# Patient Record
Sex: Female | Born: 1980 | Race: White | Hispanic: No | Marital: Single | Smoking: Never smoker
Health system: Southern US, Community
[De-identification: ages and names within clinical notes are randomized; demographics above are authoritative.]

---

## 2013-02-24 ENCOUNTER — Telehealth: Payer: Self-pay | Admitting: *Deleted

## 2013-02-24 NOTE — Telephone Encounter (Signed)
Pt called and left VM on nurse line stating that she needed refills on medications and needed and appointment. Nurse returned call, no answer, left VM.

## 2013-02-28 ENCOUNTER — Telehealth: Payer: Self-pay | Admitting: *Deleted

## 2013-02-28 NOTE — Telephone Encounter (Signed)
Pt returned nurse's call from last week. She stated that she used to be a pt here in office before previous MD left. She stated that she had not transferred and that she needed a refill on medications. She stated that she had not been to any other provider and that when MD left he gave her several refills on meds. Appointment time given on 03/14/2013 with MD. Pt appreciative and understanding.

## 2013-03-14 ENCOUNTER — Ambulatory Visit: Payer: Self-pay | Admitting: Family Medicine

## 2013-03-14 ENCOUNTER — Telehealth: Payer: Self-pay | Admitting: Family Medicine

## 2013-03-14 ENCOUNTER — Telehealth: Payer: Self-pay | Admitting: *Deleted

## 2013-03-14 NOTE — Telephone Encounter (Signed)
Please let her know i'm sorry I was not able to see her today. There are 2 options: 1 - Im set up with [employer] now, so she can be seen as early as tomorrow if she likes. We can check her thyroid level that same day and ensure the prescription she gets is correct for her current condition.  Or... 2 - If she is not able to come in this week, I can call in her current dose. However, this will be a one month supply and we may need to change the dosing before the supply is through.   Either way is fine - her preference. Let's get her scheduled for as soon as she is able. Thanks.

## 2013-03-14 NOTE — Telephone Encounter (Signed)
Patient came in the office this am, to see you for an appt, but she is [employer] and wasn't able to be seen.  Wants to know if you will write her thyroid medication until she can get in to see you.  She will be out end of this week.Marland Kitchen

## 2013-03-25 ENCOUNTER — Telehealth: Payer: Self-pay | Admitting: *Deleted

## 2013-03-25 NOTE — Telephone Encounter (Signed)
Pt called and left VM stating that she needed to make an appointment to be seen by MD. Nurse returned call, no answer, message left for callback

## 2013-03-28 ENCOUNTER — Encounter: Payer: Self-pay | Admitting: Family Medicine

## 2013-03-28 ENCOUNTER — Ambulatory Visit (INDEPENDENT_AMBULATORY_CARE_PROVIDER_SITE_OTHER): Payer: BC Managed Care – PPO | Admitting: Family Medicine

## 2013-03-28 VITALS — BP 102/70 | Ht 68.0 in | Wt 215.2 lb

## 2013-03-28 DIAGNOSIS — E039 Hypothyroidism, unspecified: Secondary | ICD-10-CM

## 2013-03-28 MED ORDER — FLUTICASONE PROPIONATE 50 MCG/ACT NA SUSP: NASAL | Status: DC

## 2013-03-28 NOTE — Patient Instructions (Signed)
Allergic Rhinitis Allergic rhinitis is when the mucous membranes in the nose respond to allergens. Allergens are particles in the air that cause your body to have an allergic reaction. This causes you to release allergic antibodies. Through a chain of events, these eventually cause you to release histamine into the blood stream (hence the use of antihistamines). Although meant to be protective to the body, it is this release that causes your discomfort, such as frequent sneezing, congestion and an itchy runny nose.  CAUSES  The pollen allergens may come from grasses, trees, and weeds. This is seasonal allergic rhinitis, or "hay fever." Other allergens cause year-round allergic rhinitis (perennial allergic rhinitis) such as house dust mite allergen, pet dander and mold spores.  SYMPTOMS   Nasal stuffiness (congestion).  Runny, itchy nose with sneezing and tearing of the eyes.  There is often an itching of the mouth, eyes and ears. It cannot be cured, but it can be controlled with medications. DIAGNOSIS  If you are unable to determine the offending allergen, skin or blood testing may find it. TREATMENT   Avoid the allergen.  Medications and allergy shots (immunotherapy) can help.  Hay fever may often be treated with antihistamines in pill or nasal spray forms. Antihistamines block the effects of histamine. There are over-the-counter medicines that may help with nasal congestion and swelling around the eyes. Check with your caregiver before taking or giving this medicine. If the treatment above does not work, there are many new medications your caregiver can prescribe. Stronger medications may be used if initial measures are ineffective. Desensitizing injections can be used if medications and avoidance fails. Desensitization is when a patient is given ongoing shots until the body becomes less sensitive to the allergen. Make sure you follow up with your caregiver if problems continue. SEEK MEDICAL  CARE IF:   You develop fever (more than 100.5 F (38.1 C).  You develop a cough that does not stop easily (persistent).  You have shortness of breath.  You start wheezing.  Symptoms interfere with normal daily activities. Document Released: 04/01/2001 Document Revised: 09/29/2011 Document Reviewed: 10/11/2008 [employer] Patient Information 2014 [employer].  

## 2013-03-28 NOTE — Progress Notes (Signed)
  Subjective:    Patient ID: Patricia Snyder, female    DOB: 02/26/1981, 32 y.o.   MRN: [id number]  HPI Pt here today with 3 chronic problems. She had a CPE 1 week ago at another facility for hew new job.   1 - hypothyroid - graves s/p RAI, stable long term, pt denies si/sx hyper or hypothyroid. Last check was 1 year ago.   2 - moles - at cpe her skin was not checked. 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 Has many freckles and worries about skin cancer. Her hairdresser mentioned a spot on her head. None specifically that have been itching or growing.   3 - seasonal allergies - worse in the fall, mainly runny nose but sometimes itchy eyes. Often will lead to sinusitis in the fall. Has never tried meds.    Review of Systems no fever, tachycardia, lethargy     Objective:   Physical Exam  Nursing note and vitals reviewed. Constitutional: She appears well-developed and well-nourished.  HENT:  Head: Normocephalic.  Neck: Normal range of motion.  Cardiovascular: Normal rate, regular rhythm and normal heart sounds.   Pulmonary/Chest: Effort normal and breath sounds normal.  Skin: Skin is warm and dry.  Many freckles. Light flat area on scalp along with several areas on her back that are concerning. One small very dark spot on abdomen.          Assessment & Plan:  Unspecified hypothyroidism - Plan: TSH, T4, free will call in synthroid after getting result today.   - allergic rhinitis - flonase, claritin prn  - multiple nevi - rtc for full skin survey and biopsies in 1 month  cpe 1 year

## 2013-03-29 ENCOUNTER — Telehealth: Payer: Self-pay | Admitting: *Deleted

## 2013-03-29 ENCOUNTER — Other Ambulatory Visit: Payer: Self-pay | Admitting: Family Medicine

## 2013-03-29 DIAGNOSIS — E039 Hypothyroidism, unspecified: Secondary | ICD-10-CM

## 2013-03-29 MED ORDER — LEVOTHYROXINE SODIUM 150 MCG PO TABS: 150.0000 ug | ORAL_TABLET | Freq: Every day | ORAL | Status: DC

## 2013-03-29 NOTE — Telephone Encounter (Signed)
Pt was notified and stated that she was on 200 mcg. She was informed that MD would call in a different dose and that she needed to have blood drawn again in 2 months. Pt understanding and appreciative

## 2013-03-29 NOTE — Telephone Encounter (Signed)
Message copied by [facility], Bonnell Public on Tue Mar 29, 2013  1:10 PM ------      Message from: Acey Lav      Created: Tue Mar 29, 2013  8:25 AM       Please let pt know that her TSH is low so we will decrease her dose of levothyroxine (synthroid). Her chart indicates that she is on currently. Please confirm this with her, and I will send in a prescription for the new dose to her pharmacy. We should recheck her tsh in about 8 weeks. i will put the future order in epic so she can go to the lab at her convenience. Thanks AW ------

## 2013-03-31 ENCOUNTER — Telehealth: Payer: Self-pay | Admitting: *Deleted

## 2013-03-31 ENCOUNTER — Other Ambulatory Visit: Payer: Self-pay | Admitting: Family Medicine

## 2013-03-31 DIAGNOSIS — F411 Generalized anxiety disorder: Secondary | ICD-10-CM

## 2013-03-31 MED ORDER — CITALOPRAM HYDROBROMIDE 40 MG PO TABS: 40.0000 mg | ORAL_TABLET | Freq: Every day | ORAL | Status: DC

## 2013-03-31 NOTE — Telephone Encounter (Signed)
Pt called and left VM stating that she was seen by MD last week and that they refilled one of her medications but not the other and she needs it refilled. Nurse called pt and she stated that she needed her celexa refilled. Will route to MD

## 2013-03-31 NOTE — Progress Notes (Signed)
Please let pt know I have sent in Celexa for her. However, since she and I have not discussed the celexa or her condition requiring it, she will need to be seen before any more will be prescribed. I have given her wnough for 3 months which should be more than enough time for her to schedule a visit. Thanks AW.

## 2013-04-01 ENCOUNTER — Telehealth: Payer: Self-pay | Admitting: *Deleted

## 2013-04-01 NOTE — Progress Notes (Signed)
See telephone enounter

## 2013-04-01 NOTE — Telephone Encounter (Signed)
Nurse called pt and informed her that MD called in a Rx for Celexa and that she supplied her with 3 months and that she would need to come in for an office visit before those 3 months are up. Pt understanding and appreciative.

## 2013-08-02 ENCOUNTER — Telehealth: Payer: Self-pay | Admitting: *Deleted

## 2013-08-02 NOTE — Telephone Encounter (Signed)
Appointment given for medication

## 2013-08-05 ENCOUNTER — Ambulatory Visit: Payer: 59 | Admitting: *Deleted

## 2013-08-05 ENCOUNTER — Ambulatory Visit (INDEPENDENT_AMBULATORY_CARE_PROVIDER_SITE_OTHER): Payer: 59 | Admitting: Family Medicine

## 2013-08-05 ENCOUNTER — Encounter: Payer: Self-pay | Admitting: Family Medicine

## 2013-08-05 VITALS — BP 112/80 | HR 72 | Temp 97.2°F | Resp 18 | Ht 66.5 in | Wt 218.0 lb

## 2013-08-05 DIAGNOSIS — E039 Hypothyroidism, unspecified: Secondary | ICD-10-CM | POA: Insufficient documentation

## 2013-08-05 NOTE — Progress Notes (Signed)
Patient ID: Patricia Snyder, female   DOB: 1981/05/30, 33 y.o.   MRN: [id number] Patient came in today just needing her blood rechecked.she's been on her new dose of Synthroid for a couple of months. She actually did not need an office visit.

## 2013-08-06 LAB — TSH: TSH: 10.873 u[IU]/mL — ABNORMAL HIGH (ref 0.350–4.500)

## 2013-08-08 ENCOUNTER — Telehealth: Payer: Self-pay

## 2013-08-08 DIAGNOSIS — E039 Hypothyroidism, unspecified: Secondary | ICD-10-CM

## 2013-08-08 NOTE — Progress Notes (Signed)
See telephone encounter.

## 2013-08-08 NOTE — Telephone Encounter (Signed)
Spoke with Pt. And she stated she is taking synthroid everyday as ordered. Will route to MD.

## 2013-08-08 NOTE — Telephone Encounter (Signed)
Message copied by America Brown on Mon Aug 08, 2013  1:49 PM ------      Message from: Doran Heater      Created: Mon Aug 08, 2013 10:06 AM       Please ask pt if she has been taking her synthroid every day. Her tsh went form quite low to too high. Thanks AW ------

## 2013-08-10 ENCOUNTER — Telehealth: Payer: Self-pay | Admitting: *Deleted

## 2013-08-10 NOTE — Telephone Encounter (Signed)
Pt called and left VM concerning medication refills. She stated she took last Synthroid today and really needs her meds refilled. Please advise

## 2013-08-11 ENCOUNTER — Telehealth: Payer: Self-pay | Admitting: *Deleted

## 2013-08-11 DIAGNOSIS — F411 Generalized anxiety disorder: Secondary | ICD-10-CM

## 2013-08-11 MED ORDER — LEVOTHYROXINE SODIUM 175 MCG PO TABS: 175.0000 ug | ORAL_TABLET | Freq: Every day | ORAL | Status: AC

## 2013-08-11 MED ORDER — CITALOPRAM HYDROBROMIDE 40 MG PO TABS: 40.0000 mg | ORAL_TABLET | Freq: Every day | ORAL | Status: AC

## 2013-08-11 NOTE — Telephone Encounter (Signed)
Please let pt know it seems 150 was too low and 200 was too high. I have sent in 175's. Lets check in 2 mos - order placed. Thanks AW

## 2013-08-11 NOTE — Telephone Encounter (Signed)
Pt called and left VM stating that she was completely out of medication and really needed refills. After recent lab work I am unaware if MD wants to change dosage. Will route to MD. Please advise. She has called multiple times.

## 2013-08-11 NOTE — Telephone Encounter (Signed)
See telephone encounter.

## 2013-08-11 NOTE — Telephone Encounter (Signed)
Spoke with pt and informed her of change in dosage with Synthroid. Pt asked about Celexa refill. After chart review noted that pt needed an appointment to refill Celexa, she came in this month but visit was changed to nurse visit and she received only a blood draw. Informed pt I would refill again and for her to make another appointment and specifically state that she needed to be seen for anxiety medication. Pt understanding and appreciative.

## 2013-08-11 NOTE — Addendum Note (Signed)
Addended by: Doran Heater on: 08/11/2013 02:48 PM   Modules accepted: Orders

## 2013-09-14 ENCOUNTER — Ambulatory Visit (INDEPENDENT_AMBULATORY_CARE_PROVIDER_SITE_OTHER): Payer: 59 | Admitting: Family Medicine

## 2013-09-14 ENCOUNTER — Encounter: Payer: Self-pay | Admitting: Family Medicine

## 2013-09-14 VITALS — BP 128/80 | HR 86 | Temp 97.3°F | Resp 18 | Ht 66.25 in | Wt 220.4 lb

## 2013-09-14 DIAGNOSIS — D229 Melanocytic nevi, unspecified: Secondary | ICD-10-CM | POA: Insufficient documentation

## 2013-09-14 DIAGNOSIS — D239 Other benign neoplasm of skin, unspecified: Secondary | ICD-10-CM

## 2013-09-14 DIAGNOSIS — F341 Dysthymic disorder: Secondary | ICD-10-CM

## 2013-09-14 DIAGNOSIS — E039 Hypothyroidism, unspecified: Secondary | ICD-10-CM

## 2013-09-14 DIAGNOSIS — F418 Other specified anxiety disorders: Secondary | ICD-10-CM | POA: Insufficient documentation

## 2013-09-14 NOTE — Patient Instructions (Signed)
Citalopram tablets What is this medicine? CITALOPRAM (sye TAL oh pram) is a medicine for depression. This medicine may be used for other purposes; ask your health care provider or pharmacist if you have questions. COMMON BRAND NAME(S): Celexa What should I tell my health care provider before I take this medicine? They need to know if you have any of these conditions: -bipolar disorder or a family history of bipolar disorder -diabetes -glaucoma -heart disease -history of irregular heartbeat -kidney or liver disease -low levels of magnesium or potassium in the blood -receiving electroconvulsive therapy -seizures (convulsions) -suicidal thoughts or a previous suicide attempt -an unusual or allergic reaction to citalopram, escitalopram, other medicines, foods, dyes, or preservatives -pregnant or trying to become pregnant -breast-feeding How should I use this medicine? Take this medicine by mouth with a glass of water. Follow the directions on the prescription label. You can take it with or without food. Take your medicine at regular intervals. Do not take your medicine more often than directed. Do not stop taking this medicine suddenly except upon the advice of your doctor. Stopping this medicine too quickly may cause serious side effects or your condition may worsen. A special MedGuide will be given to you by the pharmacist with each prescription and refill. Be sure to read this information carefully each time. Talk to your pediatrician regarding the use of this medicine in children. Special care may be needed. Patients over 65 years old may have a stronger reaction and need a smaller dose. Overdosage: If you think you have taken too much of this medicine contact a poison control center or emergency room at once. NOTE: This medicine is only for you. Do not share this medicine with others. What if I miss a dose? If you miss a dose, take it as soon as you can. If it is almost time for your  next dose, take only that dose. Do not take double or extra doses. What may interact with this medicine? Do not take this medicine with any of the following medications: -certain medicines for fungal infections like fluconazole, itraconazole, ketoconazole, posaconazole, voriconazole -cisapride -dofetilide -dronedarone -escitalopram -linezolid -MAOIs like Carbex, Eldepryl, Marplan, Nardil, and Parnate -methylene blue (injected into a vein) -pimozide -thioridazine -ziprasidone  This medicine may also interact with the following medications: -alcohol -aspirin and aspirin-like medicines -carbamazepine -certain medicines for depression, anxiety, or psychotic disturbances -certain medicines for infections like chloroquine, clarithromycin, erythromycin, furazolidone, isoniazid, pentamidine -certain medicines for migraine headaches like almotriptan, eletriptan, frovatriptan, naratriptan, rizatriptan, sumatriptan, zolmitriptan -certain medicines for sleep -certain medicines that treat or prevent blood clots like dalteparin, enoxaparin, warfarin -cimetidine -diuretics -fentanyl -lithium -methadone -metoprolol -NSAIDs, medicines for pain and inflammation, like ibuprofen or naproxen -omeprazole -other medicines that prolong the QT interval (cause an abnormal heart rhythm) -procarbazine -rasagiline -supplements like St. John's wort, kava kava, valerian -tramadol -tryptophan This list may not describe all possible interactions. Give your health care provider a list of all the medicines, herbs, non-prescription drugs, or dietary supplements you use. Also tell them if you smoke, drink alcohol, or use illegal drugs. Some items may interact with your medicine. What should I watch for while using this medicine? Tell your doctor if your symptoms do not get better or if they get worse. Visit your doctor or health care professional for regular checks on your progress. Because it may take several  weeks to see the full effects of this medicine, it is important to continue your treatment as prescribed by your doctor. Patients and  their families should watch out for new or worsening thoughts of suicide or depression. Also watch out for sudden changes in feelings such as feeling anxious, agitated, panicky, irritable, hostile, aggressive, impulsive, severely restless, overly excited and hyperactive, or not being able to sleep. If this happens, especially at the beginning of treatment or after a change in dose, call your health care professional. Dennis Bast may get drowsy or dizzy. Do not drive, use machinery, or do anything that needs mental alertness until you know how this medicine affects you. Do not stand or sit up quickly, especially if you are an older patient. This reduces the risk of dizzy or fainting spells. Alcohol may interfere with the effect of this medicine. Avoid alcoholic drinks. Your mouth may get dry. Chewing sugarless gum or sucking hard candy, and drinking plenty of water will help. Contact your doctor if the problem does not go away or is severe. What side effects may I notice from receiving this medicine? Side effects that you should report to your doctor or health care professional as soon as possible: -allergic reactions like skin rash, itching or hives, swelling of the face, lips, or tongue -chest pain -confusion -dizziness -fast, irregular heartbeat -fast talking and excited feelings or actions that are out of control -feeling faint or lightheaded, falls -hallucination, loss of contact with reality -seizures -shortness of breath -suicidal thoughts or other mood changes -unusual bleeding or bruising Side effects that usually do not require medical attention (report to your doctor or health care professional if they continue or are bothersome): -blurred vision -change in appetite -change in sex drive or performance -headache -increased sweating -nausea -trouble  sleeping This list may not describe all possible side effects. Call your doctor for medical advice about side effects. You may report side effects to FDA at 1-800-FDA-1088. Where should I keep my medicine? Keep out of reach of children. Store at room temperature between 15 and 30 degrees C (59 and 86 degrees F). Throw away any unused medicine after the expiration date. NOTE: This sheet is a summary. It may not cover all possible information. If you have questions about this medicine, talk to your doctor, pharmacist, or health care provider.  2014, [employer] Standard. (2013-01-28 13:19:48) Hypothyroidism The thyroid is a large gland located in the lower front of your neck. The thyroid gland helps control metabolism. Metabolism is how your body handles food. It controls metabolism with the hormone thyroxine. When this gland is underactive (hypothyroid), it produces too little hormone.  CAUSES These include:   Absence or destruction of thyroid tissue.  Goiter due to iodine deficiency.  Goiter due to medications.  Congenital defects (since birth).  Problems with the pituitary. This causes a lack of TSH (thyroid stimulating hormone). This hormone tells the thyroid to turn out more hormone. SYMPTOMS  Lethargy (feeling as though you have no energy)  Cold intolerance  Weight gain (in spite of normal food intake)  Dry skin  Coarse hair  Menstrual irregularity (if severe, may lead to infertility)  Slowing of thought processes Cardiac problems are also caused by insufficient amounts of thyroid hormone. Hypothyroidism in the newborn is cretinism, and is an extreme form. It is important that this form be treated adequately and immediately or it will lead rapidly to retarded physical and mental development. DIAGNOSIS  To prove hypothyroidism, your caregiver may do blood tests and ultrasound tests. Sometimes the signs are hidden. It may be necessary for your caregiver to watch this  illness with blood tests  either before or after diagnosis and treatment. TREATMENT  Low levels of thyroid hormone are increased by using synthetic thyroid hormone. This is a safe, effective treatment. It usually takes about four weeks to gain the full effects of the medication. After you have the full effect of the medication, it will generally take another four weeks for problems to leave. Your caregiver may start you on low doses. If you have had heart problems the dose may be gradually increased. It is generally not an emergency to get rapidly to normal. HOME CARE INSTRUCTIONS   Take your medications as your caregiver suggests. Let your caregiver know of any medications you are taking or start taking. Your caregiver will help you with dosage schedules.  As your condition improves, your dosage needs may increase. It will be necessary to have continuing blood tests as suggested by your caregiver.  Report all suspected medication side effects to your caregiver. SEEK MEDICAL CARE IF: Seek medical care if you develop:  Sweating.  Tremulousness (tremors).  Anxiety.  Rapid weight loss.  Heat intolerance.  Emotional swings.  Diarrhea.  Weakness. SEEK IMMEDIATE MEDICAL CARE IF:  You develop chest pain, an irregular heart beat (palpitations), or a rapid heart beat. MAKE SURE YOU:   Understand these instructions.  Will watch your condition.  Will get help right away if you are not doing well or get worse. Document Released: 07/07/2005 Document Revised: 09/29/2011 Document Reviewed: 02/25/2008 [facility] Patient Information 2014 [place].

## 2013-09-14 NOTE — Progress Notes (Signed)
Subjective:     Patient ID: Patricia Snyder, female   DOB: Dec 04, 1980, 33 y.o.   MRN: [id number]  HPI Comments: Patricia Snyder is a 33 y.o WF here for medication refills.  She has a hx of depression/anxiety and hypothyroidism.  She had her medicines filled with 2 refills on 08/11/13. She was instructed to get TSH done 2 months from that point. She also asked about her celexa refills and was told she needed an appointment. She actually got 30 pills with 2 refills on that same day.  She has been on Celexa 40mg  for years, ever since back when she was seeing [name]. She says the medicine works well and she has no compliant issues with this medicine.   She is also noted to have an elevated TSH during last visit to 10. Her Synthroid dose was changed from 150 mcg to 175 mcg daily. She has no issues with this medicine at all. She has had thyroid issues since middle school. This is when it was diagnosed and she says she had the radioactive iodine treatment to remove her thyroid. She has been on the medicine since this. She has been on 200 mcg daily at one point.   She mentions she has appt with Derm in 2 weeks for skin survey. She has a lot of moles and wanted these looked at.     Review of Systems  Constitutional: Negative for appetite change, fatigue and unexpected weight change.  HENT: Negative for trouble swallowing and voice change.   Respiratory: Negative for cough, shortness of breath and wheezing.   Gastrointestinal: Negative for diarrhea and constipation.  Endocrine: Negative for cold intolerance, heat intolerance, polydipsia and polyuria.  Allergic/Immunologic: Negative for environmental allergies and immunocompromised state.  Psychiatric/Behavioral: Negative for suicidal ideas, hallucinations, behavioral problems, confusion, sleep disturbance, self-injury, dysphoric mood, decreased concentration and agitation. The patient is not nervous/anxious and is not hyperactive.        Objective:   Physical Exam  Nursing note and vitals reviewed. Constitutional: She appears well-developed and well-nourished.  HENT:  Head: Normocephalic and atraumatic.  Right Ear: External ear normal.  Left Ear: External ear normal.  Cardiovascular: Normal rate, regular rhythm, normal heart sounds and intact distal pulses.   Pulmonary/Chest: Effort normal and breath sounds normal.  Abdominal: Soft. Bowel sounds are normal.  Skin: Skin is warm and dry.  Multiple nevi to back and trunk  Psychiatric: She has a normal mood and affect. Her behavior is normal. Judgment and thought content normal.       Assessment:     [name] was seen today for medication refill.  Diagnoses and associated orders for this visit:  Unspecified hypothyroidism - TSH; Future - T4, free; Future - T3, Free; Future - TSH - T4, free - T3, Free  Depression with anxiety  Numerous moles       Plan:     To get skin survey in a few weeks at Dermatology.  Continue celexa as prescribed. Doing well on this dose and stable. Follow up in 3-6 months. This was a follow up of this and so she can just call and get refills when she runs out in 2 months.   To get thyroid panel around 3/25 and will adjust dose according to lab results if indicated.

## 2015-10-03 ENCOUNTER — Ambulatory Visit: Payer: Self-pay

## 2015-10-03 ENCOUNTER — Other Ambulatory Visit: Payer: Self-pay | Admitting: Occupational Medicine

## 2015-10-03 DIAGNOSIS — Z0000 Encounter for general adult medical examination without abnormal findings: Secondary | ICD-10-CM

## 2016-07-22 DIAGNOSIS — J329 Chronic sinusitis, unspecified: Secondary | ICD-10-CM | POA: Diagnosis not present

## 2016-07-22 DIAGNOSIS — J039 Acute tonsillitis, unspecified: Secondary | ICD-10-CM | POA: Diagnosis not present

## 2016-10-04 IMAGING — CR DG CHEST 1V
1 series · 1 of 1 positions shown · non-contrast
Comparison: None.

CLINICAL DATA: Annual exam

EXAM:
CHEST 1 VIEW

[view not recorded]
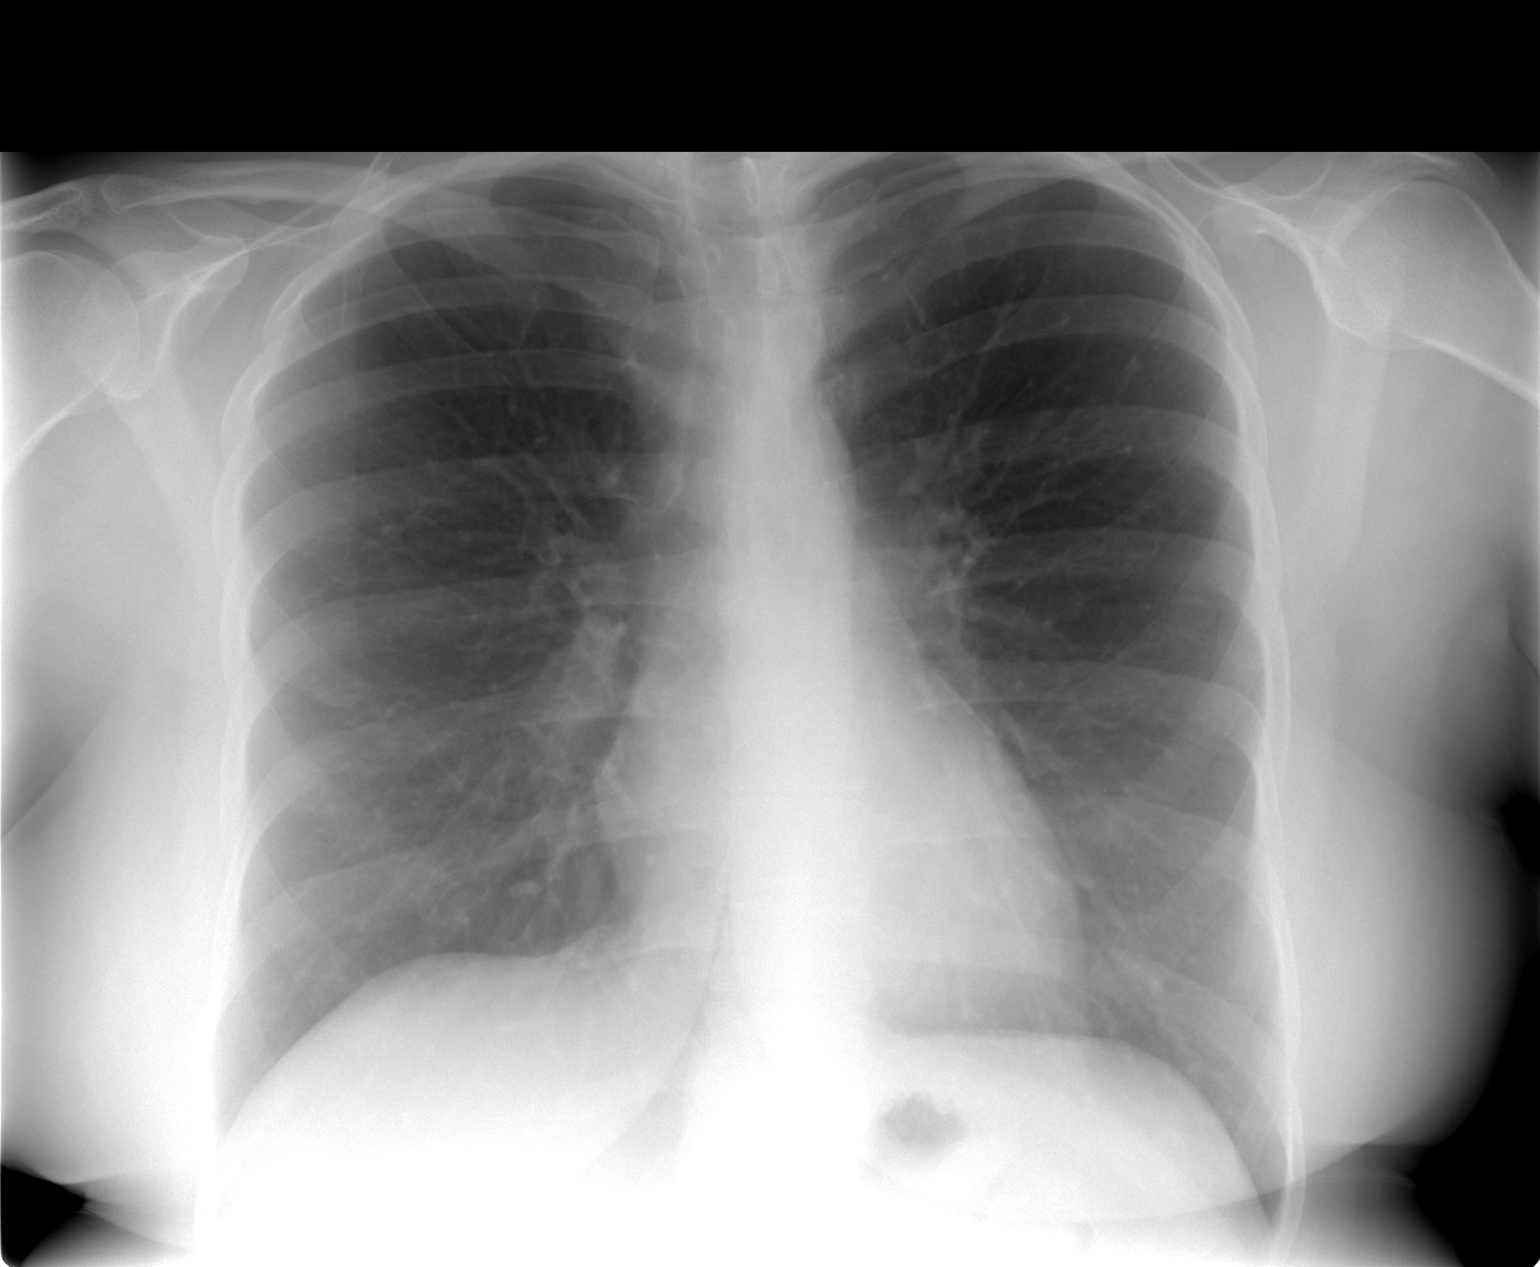

[1 of 1 positions shown; findings below may reference images not displayed]

FINDINGS: Normal heart size. Lungs clear. No pneumothorax. No pleural
effusion.
IMPRESSION: No active disease.

## 2016-11-11 DIAGNOSIS — E890 Postprocedural hypothyroidism: Secondary | ICD-10-CM | POA: Diagnosis not present

## 2016-11-11 DIAGNOSIS — E0500 Thyrotoxicosis with diffuse goiter without thyrotoxic crisis or storm: Secondary | ICD-10-CM | POA: Diagnosis not present

## 2017-01-13 DIAGNOSIS — E018 Other iodine-deficiency related thyroid disorders and allied conditions: Secondary | ICD-10-CM | POA: Diagnosis not present

## 2017-01-13 DIAGNOSIS — R7309 Other abnormal glucose: Secondary | ICD-10-CM | POA: Diagnosis not present

## 2017-04-27 DIAGNOSIS — E890 Postprocedural hypothyroidism: Secondary | ICD-10-CM | POA: Diagnosis not present

## 2017-06-01 DIAGNOSIS — E890 Postprocedural hypothyroidism: Secondary | ICD-10-CM | POA: Diagnosis not present

## 2017-06-01 DIAGNOSIS — E0500 Thyrotoxicosis with diffuse goiter without thyrotoxic crisis or storm: Secondary | ICD-10-CM | POA: Diagnosis not present

## 2017-09-30 DIAGNOSIS — L282 Other prurigo: Secondary | ICD-10-CM | POA: Diagnosis not present

## 2017-12-17 DIAGNOSIS — R7309 Other abnormal glucose: Secondary | ICD-10-CM | POA: Diagnosis not present

## 2017-12-17 DIAGNOSIS — E018 Other iodine-deficiency related thyroid disorders and allied conditions: Secondary | ICD-10-CM | POA: Diagnosis not present

## 2018-02-09 DIAGNOSIS — J020 Streptococcal pharyngitis: Secondary | ICD-10-CM | POA: Diagnosis not present

## 2018-02-09 DIAGNOSIS — J019 Acute sinusitis, unspecified: Secondary | ICD-10-CM | POA: Diagnosis not present

## 2018-05-16 DIAGNOSIS — J019 Acute sinusitis, unspecified: Secondary | ICD-10-CM | POA: Diagnosis not present

## 2018-06-23 DIAGNOSIS — E018 Other iodine-deficiency related thyroid disorders and allied conditions: Secondary | ICD-10-CM | POA: Diagnosis not present

## 2018-06-23 DIAGNOSIS — R7309 Other abnormal glucose: Secondary | ICD-10-CM | POA: Diagnosis not present

## 2018-06-23 DIAGNOSIS — E669 Obesity, unspecified: Secondary | ICD-10-CM | POA: Diagnosis not present

## 2018-06-23 DIAGNOSIS — R739 Hyperglycemia, unspecified: Secondary | ICD-10-CM | POA: Diagnosis not present

## 2018-11-10 DIAGNOSIS — E890 Postprocedural hypothyroidism: Secondary | ICD-10-CM | POA: Diagnosis not present

## 2018-11-22 DIAGNOSIS — E0500 Thyrotoxicosis with diffuse goiter without thyrotoxic crisis or storm: Secondary | ICD-10-CM | POA: Diagnosis not present

## 2018-11-22 DIAGNOSIS — E890 Postprocedural hypothyroidism: Secondary | ICD-10-CM | POA: Diagnosis not present

## 2021-11-27 ENCOUNTER — Other Ambulatory Visit: Payer: Self-pay | Admitting: Family Medicine

## 2021-11-27 ENCOUNTER — Ambulatory Visit: Payer: Self-pay

## 2021-11-27 DIAGNOSIS — Z0000 Encounter for general adult medical examination without abnormal findings: Secondary | ICD-10-CM

## 2022-11-29 IMAGING — DX DG CHEST 1V
1 series · 1 of 1 positions shown · non-contrast
Comparison: 10/03/2015

CLINICAL DATA: Screening physical examination

EXAM:
CHEST  1 VIEW

[chest pa]
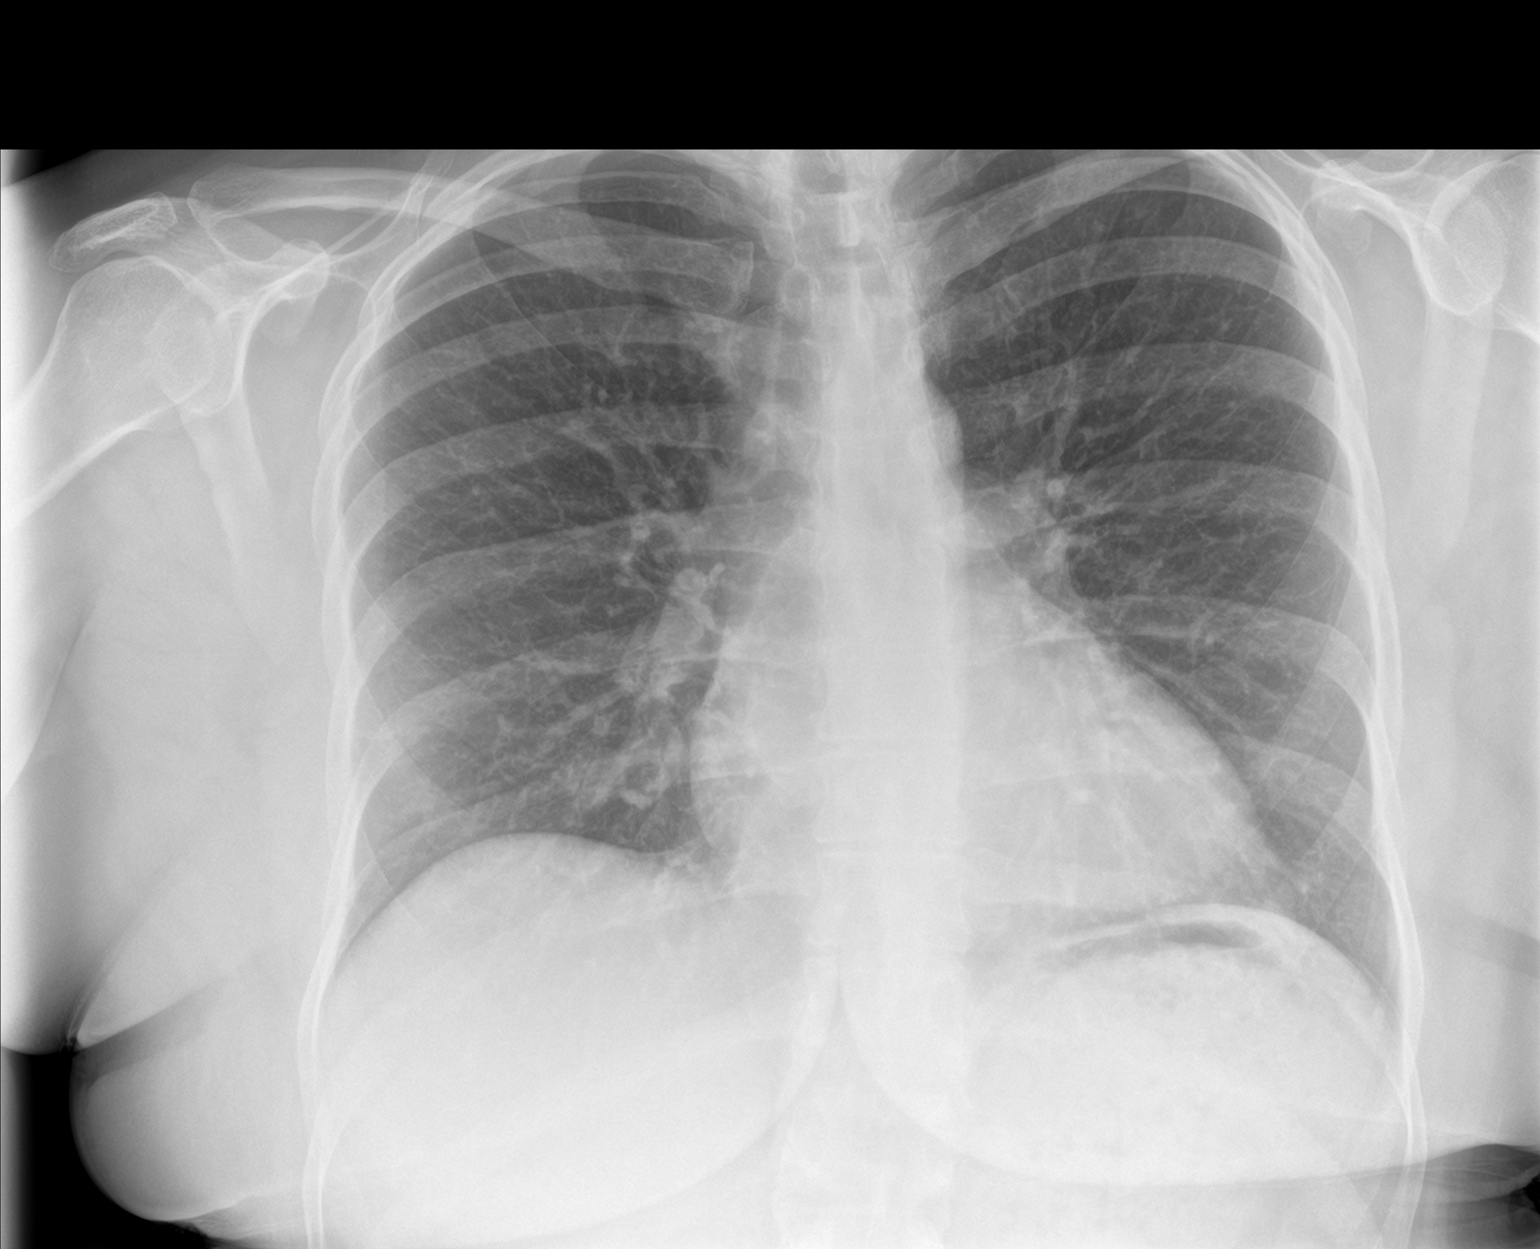

[1 of 1 positions shown; findings below may reference images not displayed]

FINDINGS: The heart size and mediastinal contours are within normal limits.
Both lungs are clear. The visualized skeletal structures are
unremarkable.
IMPRESSION: No active disease.
